# Patient Record
Sex: Female | Born: 1960 | Race: White | Hispanic: No | Marital: Married | Smoking: Never smoker
Health system: Southern US, Community
[De-identification: ages and names within clinical notes are randomized; demographics above are authoritative.]

## PROBLEM LIST (undated history)

## (undated) DIAGNOSIS — E079 Disorder of thyroid, unspecified: Secondary | ICD-10-CM

---

## 2014-08-01 ENCOUNTER — Emergency Department (HOSPITAL_BASED_OUTPATIENT_CLINIC_OR_DEPARTMENT_OTHER): Payer: No Typology Code available for payment source

## 2014-08-01 ENCOUNTER — Encounter (HOSPITAL_BASED_OUTPATIENT_CLINIC_OR_DEPARTMENT_OTHER): Payer: Self-pay

## 2014-08-01 ENCOUNTER — Emergency Department (HOSPITAL_BASED_OUTPATIENT_CLINIC_OR_DEPARTMENT_OTHER)
Admission: EM | Admit: 2014-08-01 | Discharge: 2014-08-01 | Disposition: A | Discharge status: Home / Self Care | Payer: No Typology Code available for payment source | Attending: Emergency Medicine | Admitting: Emergency Medicine

## 2014-08-01 DIAGNOSIS — E079 Disorder of thyroid, unspecified: Secondary | ICD-10-CM | POA: Diagnosis not present

## 2014-08-01 DIAGNOSIS — S6991XA Unspecified injury of right wrist, hand and finger(s), initial encounter: Secondary | ICD-10-CM | POA: Diagnosis not present

## 2014-08-01 DIAGNOSIS — Y998 Other external cause status: Secondary | ICD-10-CM | POA: Diagnosis not present

## 2014-08-01 DIAGNOSIS — S2220XA Unspecified fracture of sternum, initial encounter for closed fracture: Secondary | ICD-10-CM | POA: Diagnosis not present

## 2014-08-01 DIAGNOSIS — Z7951 Long term (current) use of inhaled steroids: Secondary | ICD-10-CM | POA: Insufficient documentation

## 2014-08-01 DIAGNOSIS — Z79899 Other long term (current) drug therapy: Secondary | ICD-10-CM | POA: Diagnosis not present

## 2014-08-01 DIAGNOSIS — Y92410 Unspecified street and highway as the place of occurrence of the external cause: Secondary | ICD-10-CM | POA: Insufficient documentation

## 2014-08-01 DIAGNOSIS — Y9389 Activity, other specified: Secondary | ICD-10-CM | POA: Diagnosis not present

## 2014-08-01 DIAGNOSIS — S20359A Superficial foreign body of unspecified front wall of thorax, initial encounter: Secondary | ICD-10-CM | POA: Diagnosis present

## 2014-08-01 HISTORY — DX: Disorder of thyroid, unspecified: E07.9

## 2014-08-01 LAB — CREATININE, SERUM
Creatinine, Ser: 0.76 mg/dL (ref 0.44–1.00)
GFR calc Af Amer: >60 mL/min (ref >60)
GFR calc non Af Amer: >60 mL/min (ref >60)

## 2014-08-01 LAB — I-STAT CG4 LACTIC ACID, ED: LACTIC ACID, VENOUS: 0.93 mmol/L (ref 0.5–2.0)

## 2014-08-01 MED ORDER — IOHEXOL 300 MG/ML  SOLN
80.0000 mL | Freq: Once | INTRAMUSCULAR | Status: AC | PRN
  Administered 2014-08-01: 75 mL via INTRAVENOUS

## 2014-08-01 MED ORDER — HYDROCODONE-ACETAMINOPHEN 5-325 MG PO TABS: 1.0000 | ORAL_TABLET | Freq: Four times a day (QID) | ORAL | Status: AC | PRN
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
